# Patient Record
Sex: Female | Born: 1985 | Race: White | Hispanic: No | Marital: Single | Smoking: Never smoker
Health system: Southern US, Community
[De-identification: ages and names within clinical notes are randomized; demographics above are authoritative.]

## PROBLEM LIST (undated history)

## (undated) ENCOUNTER — Inpatient Hospital Stay (HOSPITAL_COMMUNITY): Payer: Self-pay

## (undated) DIAGNOSIS — F329 Major depressive disorder, single episode, unspecified: Secondary | ICD-10-CM

## (undated) DIAGNOSIS — F32A Depression, unspecified: Secondary | ICD-10-CM

## (undated) DIAGNOSIS — Z789 Other specified health status: Secondary | ICD-10-CM

## (undated) HISTORY — PX: NO PAST SURGERIES: SHX2092

---

## 2007-12-02 ENCOUNTER — Ambulatory Visit (HOSPITAL_COMMUNITY): Admission: RE | Admit: 2007-12-02 | Discharge: 2007-12-02 | Payer: Self-pay | Admitting: Obstetrics

## 2007-12-09 ENCOUNTER — Inpatient Hospital Stay (HOSPITAL_COMMUNITY): Admission: AD | Admit: 2007-12-09 | Discharge: 2007-12-09 | Payer: Self-pay | Admitting: Obstetrics

## 2008-02-10 ENCOUNTER — Inpatient Hospital Stay (HOSPITAL_COMMUNITY): Admission: AD | Admit: 2008-02-10 | Discharge: 2008-02-10 | Payer: Self-pay | Admitting: Obstetrics

## 2008-05-12 ENCOUNTER — Inpatient Hospital Stay (HOSPITAL_COMMUNITY): Admission: AD | Admit: 2008-05-12 | Discharge: 2008-05-12 | Payer: Self-pay | Admitting: Obstetrics

## 2008-07-27 ENCOUNTER — Inpatient Hospital Stay (HOSPITAL_COMMUNITY): Admission: AD | Admit: 2008-07-27 | Discharge: 2008-07-27 | Payer: Self-pay | Admitting: Obstetrics

## 2008-07-30 ENCOUNTER — Inpatient Hospital Stay (HOSPITAL_COMMUNITY): Admission: AD | Admit: 2008-07-30 | Discharge: 2008-07-30 | Payer: Self-pay | Admitting: Obstetrics

## 2008-08-05 ENCOUNTER — Inpatient Hospital Stay (HOSPITAL_COMMUNITY): Admission: AD | Admit: 2008-08-05 | Discharge: 2008-08-07 | Payer: Self-pay | Admitting: Obstetrics

## 2009-12-19 IMAGING — US US OB COMP LESS 14 WK
1 series · 14 of 28 positions shown · non-contrast
Comparison: none

OBSTETRICAL ULTRASOUND:
 This ultrasound exam was performed in the [HOSPITAL] Ultrasound Department.  The OB US report was generated in the AS system, and faxed to the ordering physician.  This report is also available in [REDACTED] PACS.

[Series 1: us ob comp less 14 wks · 14 of 36 slices shown]
[im 2/36]
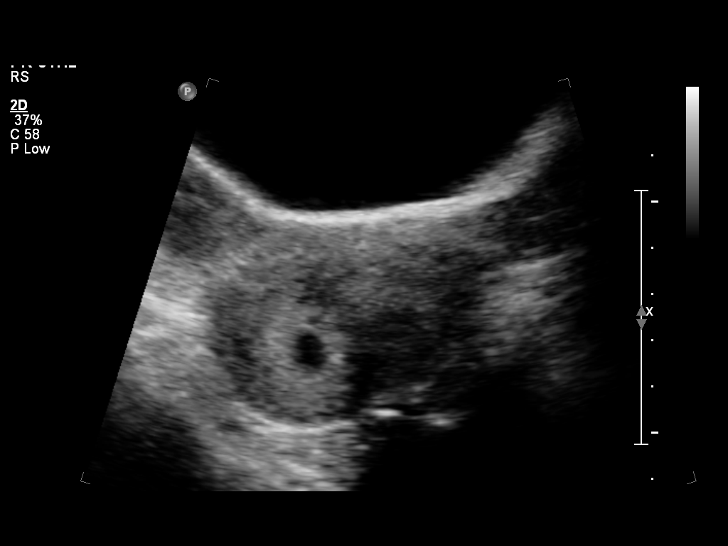
[im 4/36]
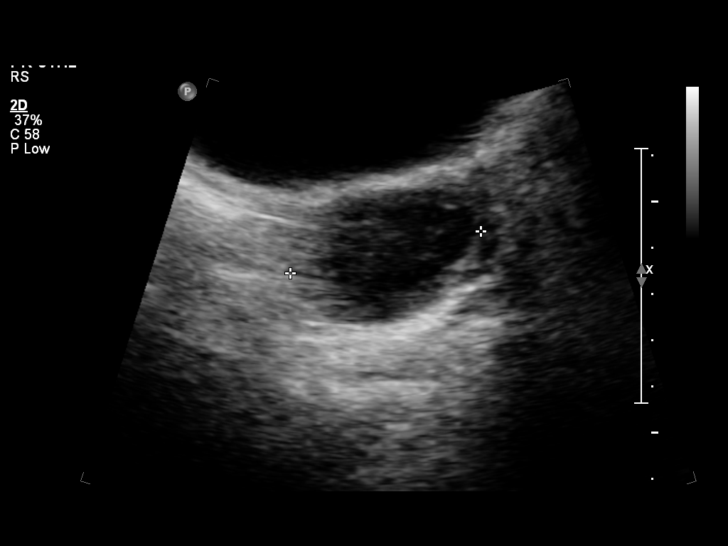
[im 7/36]
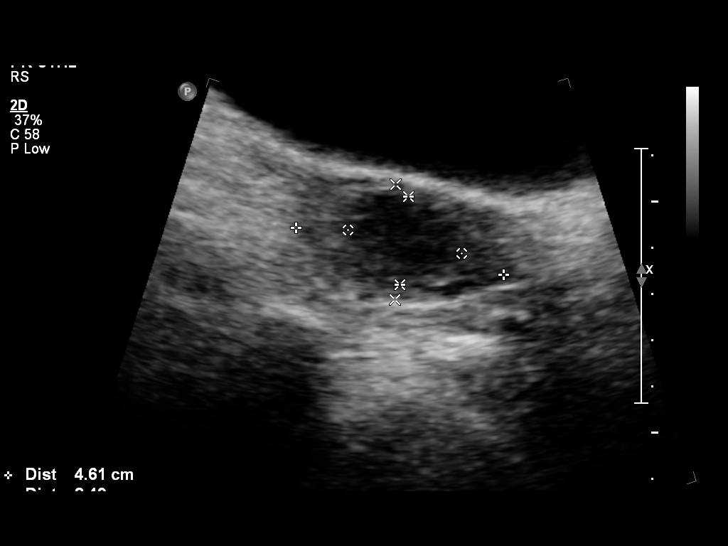
[im 10/36]
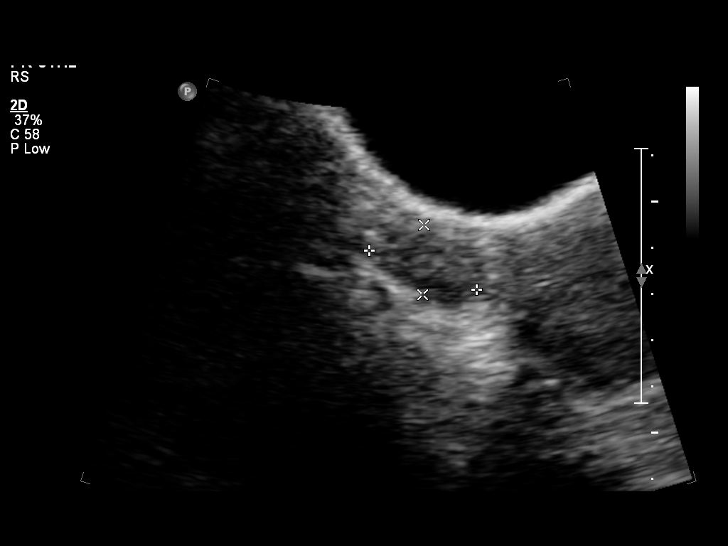
[im 12/36]
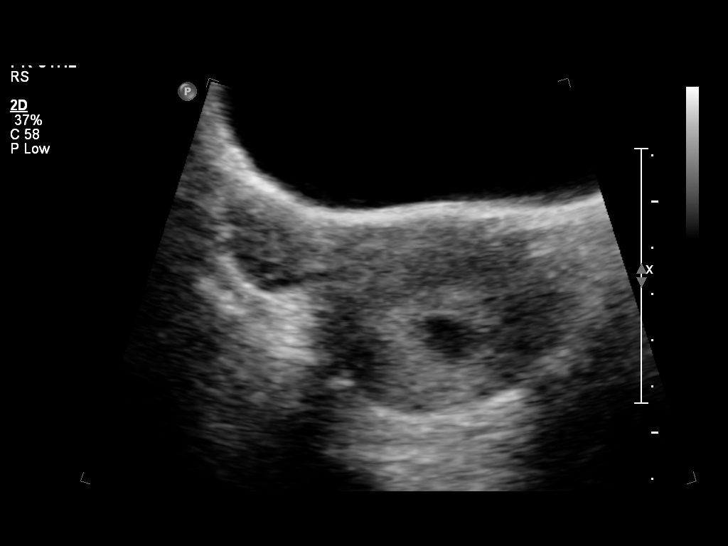
[im 15/36]
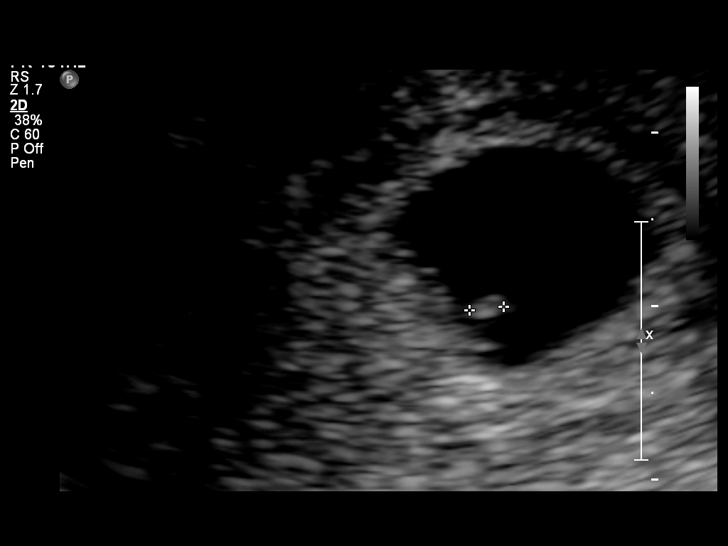
[im 17/36]
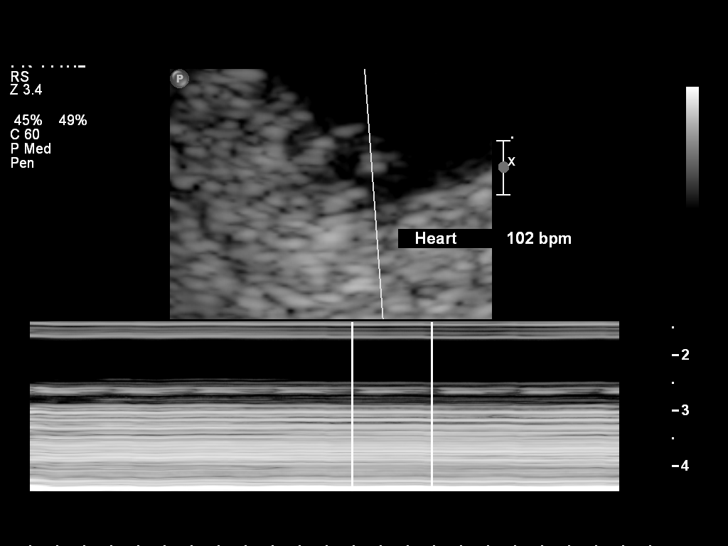
[im 20/36]
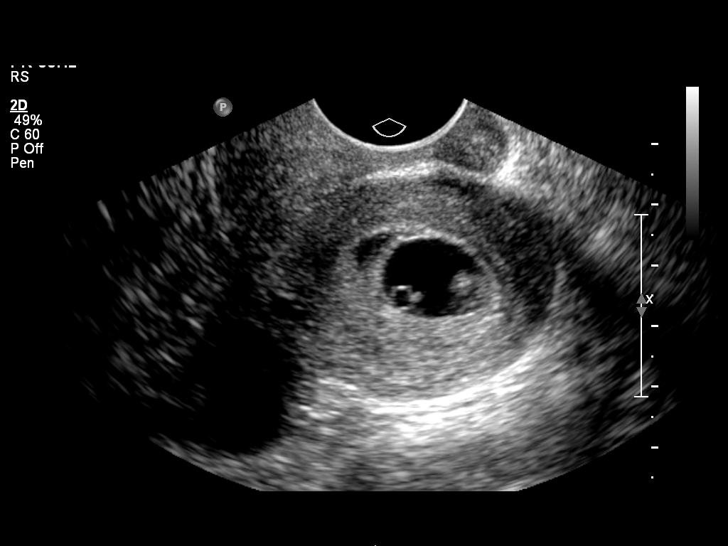
[im 23/36]
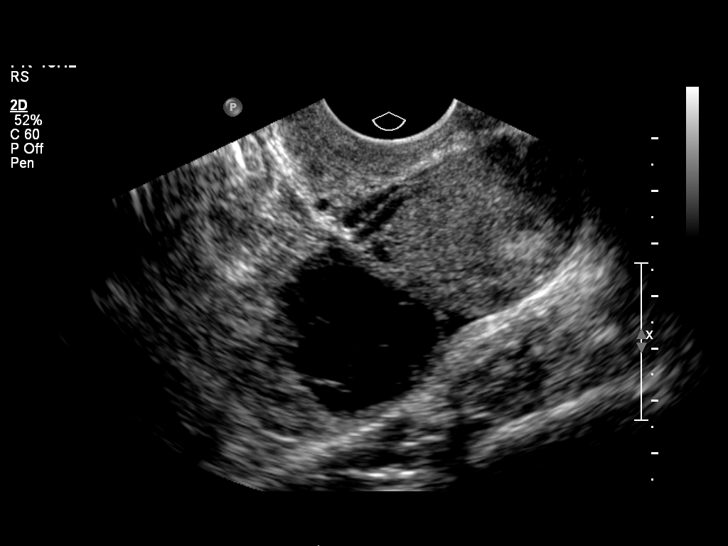
[im 25/36]
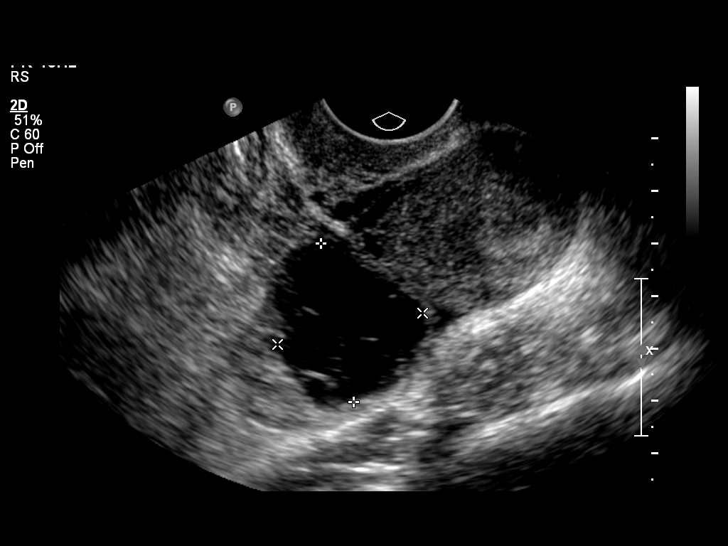
[im 28/36]
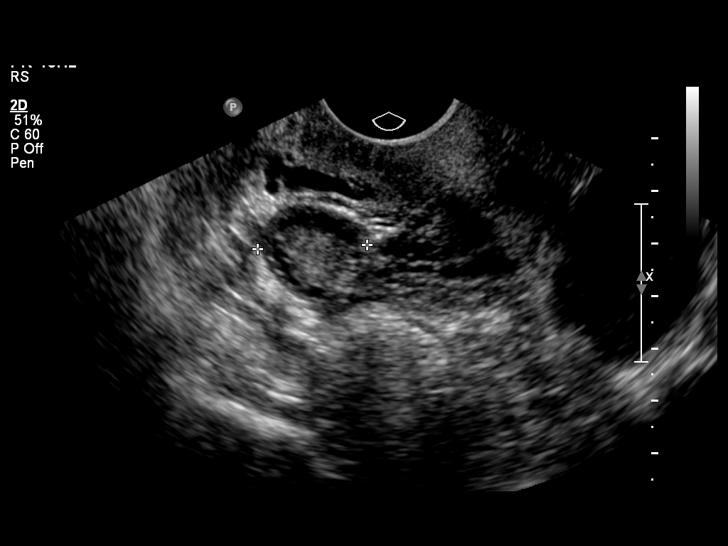
[im 30/36]
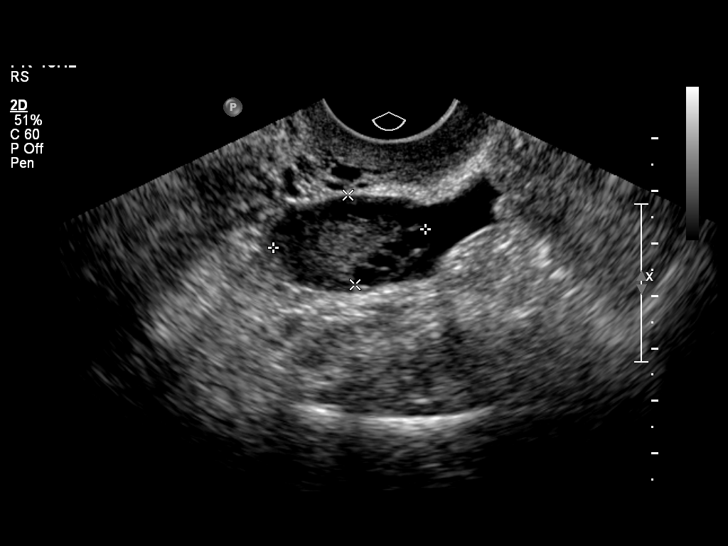
[im 33/36]
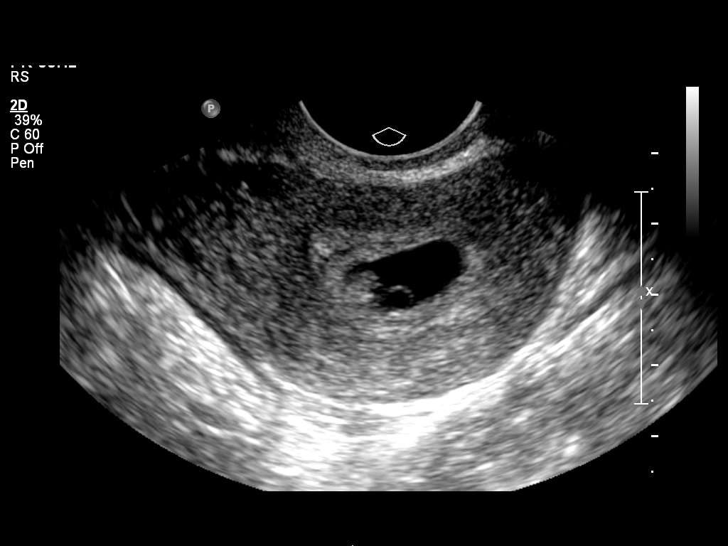
[im 36/36]
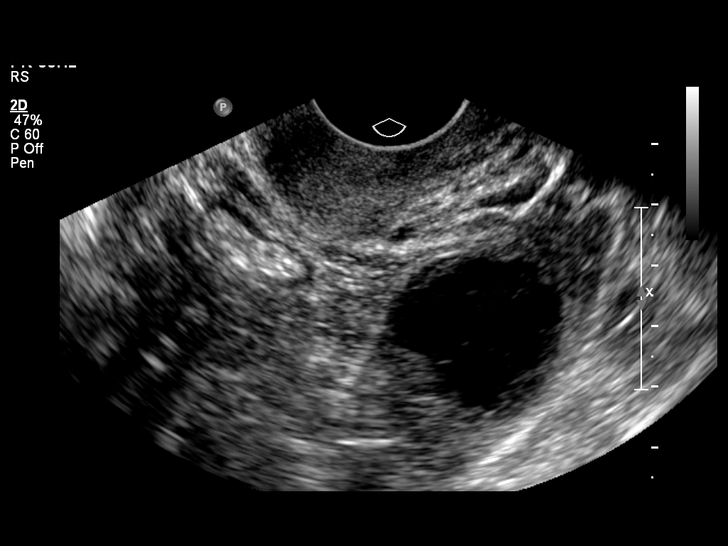

[14 of 28 positions shown; findings below may reference images not displayed]

IMPRESSION: See AS Obstetric US report.

## 2010-08-02 LAB — URINALYSIS, ROUTINE W REFLEX MICROSCOPIC
Bilirubin Urine: NEGATIVE
Hgb urine dipstick: NEGATIVE
Ketones, ur: NEGATIVE mg/dL
Protein, ur: NEGATIVE mg/dL
Urobilinogen, UA: 0.2 mg/dL (ref 0.0–1.0)
pH: 6.5 (ref 5.0–8.0)

## 2010-08-02 LAB — CBC
HCT: 27.6 % — ABNORMAL LOW (ref 36.0–46.0)
Hemoglobin: 9.6 g/dL — ABNORMAL LOW (ref 12.0–15.0)
MCHC: 34.6 g/dL (ref 30.0–36.0)
MCV: 89.8 fL (ref 78.0–100.0)
Platelets: 147 10*3/uL — ABNORMAL LOW (ref 150–400)
Platelets: 165 10*3/uL (ref 150–400)
RDW: 14.6 % (ref 11.5–15.5)
WBC: 18 10*3/uL — ABNORMAL HIGH (ref 4.0–10.5)

## 2010-08-07 LAB — RH IMMUNE GLOBULIN WORKUP (NOT WOMEN'S HOSP): ABO/RH(D): A NEG

## 2011-03-29 LAB — OB RESULTS CONSOLE GC/CHLAMYDIA
Chlamydia: NEGATIVE
Gonorrhea: NEGATIVE

## 2011-03-29 LAB — OB RESULTS CONSOLE HEPATITIS B SURFACE ANTIGEN: Hepatitis B Surface Ag: NEGATIVE

## 2011-03-29 LAB — OB RESULTS CONSOLE HIV ANTIBODY (ROUTINE TESTING): HIV: NONREACTIVE

## 2011-04-24 NOTE — L&D Delivery Note (Signed)
Delivery Note At 2:30 AM a viable female was delivered via Vaginal, Spontaneous Delivery (Presentation: ;  ).  APGAR: , ; weight .   Placenta status: , .  Cord:  with the following complications: .  Cord pH: not done  Anesthesia: Epidural  Episiotomy:  Lacerations:  Suture Repair: 2.0 Est. Blood Loss (mL):   Mom to postpartum.  Baby to nursery-stable.  Jaxxon Naeem A 09/11/2011, 2:37 AM

## 2011-06-25 ENCOUNTER — Other Ambulatory Visit: Payer: Self-pay | Admitting: Obstetrics

## 2011-07-02 ENCOUNTER — Encounter (HOSPITAL_COMMUNITY): Payer: Self-pay | Admitting: *Deleted

## 2011-07-02 ENCOUNTER — Inpatient Hospital Stay (HOSPITAL_COMMUNITY)
Admission: AD | Admit: 2011-07-02 | Discharge: 2011-07-02 | Disposition: A | Discharge status: Home Health | Payer: Medicaid Other | Source: Ambulatory Visit | Attending: Obstetrics | Admitting: Obstetrics

## 2011-07-02 DIAGNOSIS — Z2989 Encounter for other specified prophylactic measures: Secondary | ICD-10-CM | POA: Insufficient documentation

## 2011-07-02 DIAGNOSIS — Z298 Encounter for other specified prophylactic measures: Secondary | ICD-10-CM | POA: Insufficient documentation

## 2011-07-02 DIAGNOSIS — Z3480 Encounter for supervision of other normal pregnancy, unspecified trimester: Secondary | ICD-10-CM | POA: Insufficient documentation

## 2011-07-02 MED ORDER — RHO D IMMUNE GLOBULIN 1500 UNIT/2ML IJ SOLN
300.0000 ug | Freq: Once | INTRAMUSCULAR | Status: AC
  Administered 2011-07-02: 300 ug via INTRAMUSCULAR
  Filled 2011-07-02: qty 2

## 2011-07-02 NOTE — MAU Note (Signed)
Pt sttes the MD instructed her to come here and receive a rhogram shot

## 2011-07-03 LAB — RH IG WORKUP (INCLUDES ABO/RH)
Antibody Screen: NEGATIVE
Gestational Age(Wks): 31.1
Unit division: 0

## 2011-07-24 LAB — OB RESULTS CONSOLE GBS: GBS: NEGATIVE

## 2011-09-06 ENCOUNTER — Encounter (HOSPITAL_COMMUNITY): Payer: Self-pay | Admitting: *Deleted

## 2011-09-06 ENCOUNTER — Telehealth (HOSPITAL_COMMUNITY): Payer: Self-pay | Admitting: *Deleted

## 2011-09-06 NOTE — Telephone Encounter (Signed)
Preadmission screen  

## 2011-09-07 ENCOUNTER — Encounter (HOSPITAL_COMMUNITY): Payer: Self-pay | Admitting: *Deleted

## 2011-09-07 ENCOUNTER — Encounter (HOSPITAL_COMMUNITY): Payer: Self-pay

## 2011-09-07 ENCOUNTER — Inpatient Hospital Stay (HOSPITAL_COMMUNITY)
Admission: AD | Admit: 2011-09-07 | Discharge: 2011-09-07 | Disposition: A | Discharge status: Home / Self Care | Payer: Medicaid Other | Source: Ambulatory Visit | Attending: Obstetrics | Admitting: Obstetrics

## 2011-09-07 ENCOUNTER — Inpatient Hospital Stay (HOSPITAL_COMMUNITY)
Admission: AD | Admit: 2011-09-07 | Discharge: 2011-09-08 | Disposition: A | Discharge status: Home / Self Care | Payer: Medicaid Other | Source: Ambulatory Visit | Attending: Obstetrics | Admitting: Obstetrics

## 2011-09-07 DIAGNOSIS — M549 Dorsalgia, unspecified: Secondary | ICD-10-CM | POA: Insufficient documentation

## 2011-09-07 DIAGNOSIS — O479 False labor, unspecified: Secondary | ICD-10-CM | POA: Insufficient documentation

## 2011-09-07 HISTORY — DX: Other specified health status: Z78.9

## 2011-09-07 NOTE — Discharge Instructions (Signed)

## 2011-09-07 NOTE — MAU Note (Signed)
Painful contractions every 3 minutes since 10pm tonight. Denies leaking of fluid or vaginal bleeding. Reports positive fetal movement. Denies complications with pregnancy.

## 2011-09-07 NOTE — MAU Note (Signed)
Pt C/O lower back pain since 1300, took Tylenol with Codeine with no relief, uc's becoming more regular, denies bleeding or LOF.

## 2011-09-07 NOTE — Progress Notes (Signed)
Dr. Clearance Coots in MAU, informed him of pt status, SVE 1-2/60/-2... See DC order.

## 2011-09-08 MED ORDER — OXYCODONE-ACETAMINOPHEN 5-325 MG PO TABS
2.0000 | ORAL_TABLET | Freq: Once | ORAL | Status: AC
  Administered 2011-09-08: 2 via ORAL
  Filled 2011-09-08: qty 2

## 2011-09-08 NOTE — Discharge Instructions (Signed)

## 2011-09-10 ENCOUNTER — Encounter (HOSPITAL_COMMUNITY): Payer: Self-pay | Admitting: Anesthesiology

## 2011-09-10 ENCOUNTER — Inpatient Hospital Stay (HOSPITAL_COMMUNITY)
Admission: RE | Admit: 2011-09-10 | Discharge: 2011-09-13 | DRG: 775 | Disposition: A | Discharge status: Home / Self Care | Payer: Medicaid Other | Source: Ambulatory Visit | Attending: Obstetrics | Admitting: Obstetrics

## 2011-09-10 ENCOUNTER — Inpatient Hospital Stay (HOSPITAL_COMMUNITY): Payer: Medicaid Other | Admitting: Anesthesiology

## 2011-09-10 ENCOUNTER — Encounter (HOSPITAL_COMMUNITY): Payer: Self-pay

## 2011-09-10 DIAGNOSIS — O480 Post-term pregnancy: Principal | ICD-10-CM | POA: Diagnosis present

## 2011-09-10 LAB — CBC
HCT: 35.4 % — ABNORMAL LOW (ref 36.0–46.0)
MCH: 29.9 pg (ref 26.0–34.0)
MCHC: 33.3 g/dL (ref 30.0–36.0)
MCV: 89.6 fL (ref 78.0–100.0)
RDW: 13.6 % (ref 11.5–15.5)

## 2011-09-10 MED ORDER — PHENYLEPHRINE 40 MCG/ML (10ML) SYRINGE FOR IV PUSH (FOR BLOOD PRESSURE SUPPORT): 80.0000 ug | PREFILLED_SYRINGE | INTRAVENOUS | Status: DC | PRN

## 2011-09-10 MED ORDER — CITRIC ACID-SODIUM CITRATE 334-500 MG/5ML PO SOLN: 30.0000 mL | ORAL | Status: DC | PRN

## 2011-09-10 MED ORDER — EPHEDRINE 5 MG/ML INJ: 10.0000 mg | INTRAVENOUS | Status: DC | PRN

## 2011-09-10 MED ORDER — ONDANSETRON HCL 4 MG/2ML IJ SOLN
4.0000 mg | Freq: Four times a day (QID) | INTRAMUSCULAR | Status: DC | PRN
  Administered 2011-09-10: 4 mg via INTRAVENOUS
  Filled 2011-09-10: qty 2

## 2011-09-10 MED ORDER — PHENYLEPHRINE 40 MCG/ML (10ML) SYRINGE FOR IV PUSH (FOR BLOOD PRESSURE SUPPORT)
80.0000 ug | PREFILLED_SYRINGE | INTRAVENOUS | Status: DC | PRN
  Filled 2011-09-10 (×2): qty 5

## 2011-09-10 MED ORDER — TERBUTALINE SULFATE 1 MG/ML IJ SOLN: 0.2500 mg | Freq: Once | INTRAMUSCULAR | Status: AC | PRN

## 2011-09-10 MED ORDER — EPHEDRINE 5 MG/ML INJ
10.0000 mg | INTRAVENOUS | Status: DC | PRN
  Filled 2011-09-10 (×2): qty 4

## 2011-09-10 MED ORDER — DIPHENHYDRAMINE HCL 50 MG/ML IJ SOLN
12.5000 mg | INTRAMUSCULAR | Status: DC | PRN
Start: 2011-09-10 — End: 2011-09-13
  Administered 2011-09-10 (×2): 12.5 mg via INTRAVENOUS
  Filled 2011-09-10: qty 1

## 2011-09-10 MED ORDER — FENTANYL 2.5 MCG/ML BUPIVACAINE 1/10 % EPIDURAL INFUSION (WH - ANES)
14.0000 mL/h | INTRAMUSCULAR | Status: DC
  Administered 2011-09-10 – 2011-09-11 (×4): 14 mL/h via EPIDURAL
  Filled 2011-09-10 (×4): qty 60

## 2011-09-10 MED ORDER — LACTATED RINGERS IV SOLN
INTRAVENOUS | Status: DC
  Administered 2011-09-10 (×4): via INTRAVENOUS

## 2011-09-10 MED ORDER — LACTATED RINGERS IV SOLN
500.0000 mL | INTRAVENOUS | Status: DC | PRN
Start: 2011-09-10 — End: 2011-09-11

## 2011-09-10 MED ORDER — BUTORPHANOL TARTRATE 2 MG/ML IJ SOLN: 1.0000 mg | INTRAMUSCULAR | Status: DC | PRN

## 2011-09-10 MED ORDER — LIDOCAINE HCL (PF) 1 % IJ SOLN
INTRAMUSCULAR | Status: DC | PRN
  Administered 2011-09-10 (×3): 4 mL

## 2011-09-10 MED ORDER — LACTATED RINGERS IV SOLN: 500.0000 mL | Freq: Once | INTRAVENOUS | Status: DC

## 2011-09-10 MED ORDER — OXYCODONE-ACETAMINOPHEN 5-325 MG PO TABS
1.0000 | ORAL_TABLET | ORAL | Status: DC | PRN
  Administered 2011-09-12 – 2011-09-13 (×3): 1 via ORAL
  Filled 2011-09-10 (×2): qty 1

## 2011-09-10 MED ORDER — OXYTOCIN 20 UNITS IN LACTATED RINGERS INFUSION - SIMPLE
1.0000 m[IU]/min | INTRAVENOUS | Status: DC
  Administered 2011-09-10: 1 m[IU]/min via INTRAVENOUS
  Filled 2011-09-10: qty 1000

## 2011-09-10 MED ORDER — OXYTOCIN BOLUS FROM INFUSION
500.0000 mL | Freq: Once | INTRAVENOUS | Status: DC
  Filled 2011-09-10: qty 500

## 2011-09-10 MED ORDER — OXYTOCIN 20 UNITS IN LACTATED RINGERS INFUSION - SIMPLE
125.0000 mL/h | Freq: Once | INTRAVENOUS | Status: AC
  Administered 2011-09-11: 125 mL/h via INTRAVENOUS
  Filled 2011-09-10: qty 1000

## 2011-09-10 MED ORDER — ACETAMINOPHEN 325 MG PO TABS: 650.0000 mg | ORAL_TABLET | ORAL | Status: DC | PRN

## 2011-09-10 MED ORDER — IBUPROFEN 600 MG PO TABS
600.0000 mg | ORAL_TABLET | Freq: Four times a day (QID) | ORAL | Status: DC | PRN
  Filled 2011-09-10 (×5): qty 1

## 2011-09-10 MED ORDER — FLEET ENEMA 7-19 GM/118ML RE ENEM: 1.0000 | ENEMA | RECTAL | Status: DC | PRN

## 2011-09-10 MED ORDER — LIDOCAINE HCL (PF) 1 % IJ SOLN
30.0000 mL | INTRAMUSCULAR | Status: DC | PRN
  Filled 2011-09-10: qty 30

## 2011-09-10 NOTE — Progress Notes (Signed)
Patient ID: Cynthia Hawkins, female   DOB: 18-Mar-1986, 26 y.o.   MRN: [id number] Patient is still 1-2 cm 85% vertex -3 amniotomy was performed the fluid was slight meconium she is on 5 milliunits of Pitocin tracing is reactive

## 2011-09-10 NOTE — H&P (Signed)
This is Dr. Francoise Ceo dictating the history and physical on  Cynthia Hawkins she is a gravida 2 para 101 at 41 weeks and 1 day she is due and 09/02/2011 negative GBS and she brought in for induction cervix is 1 cm 80% vertex -3 station membranes intact she is on low-dose Pitocin 5 milliunits per minute at the present time contracting every 3-4 minutes Past medical history negative Past surgical history negative Social history negative System review negative Physical exam revealed a well-developed female in no distress Lungs clear to P&A Heart regular rhythm no murmurs no gallops Lungs clear to P&A Abdomen term estimated fetal weight between 7 and 8 pounds Pelvic as described above Extremities negative

## 2011-09-10 NOTE — Anesthesia Preprocedure Evaluation (Signed)
Anesthesia Evaluation  Patient identified by MRN, date of birth, ID band Patient awake    Reviewed: Allergy & Precautions, H&P , NPO status , Patient's Chart, lab work & pertinent test results, reviewed documented beta blocker date and time   History of Anesthesia Complications Negative for: history of anesthetic complications  Airway Mallampati: II TM Distance: >3 FB Neck ROM: full    Dental  (+) Teeth Intact   Pulmonary neg pulmonary ROS,  breath sounds clear to auscultation        Cardiovascular negative cardio ROS  Rhythm:regular Rate:Normal     Neuro/Psych Has been on tylenol #3 since January for back pain with pregnancy negative neurological ROS  negative psych ROS   GI/Hepatic negative GI ROS, Neg liver ROS,   Endo/Other  Morbid obesity  Renal/GU negative Renal ROS  negative genitourinary   Musculoskeletal   Abdominal   Peds  Hematology negative hematology ROS (+)   Anesthesia Other Findings   Reproductive/Obstetrics (+) Pregnancy                           Anesthesia Physical Anesthesia Plan  ASA: II  Anesthesia Plan: Epidural   Post-op Pain Management:    Induction:   Airway Management Planned:   Additional Equipment:   Intra-op Plan:   Post-operative Plan:   Informed Consent: I have reviewed the patients History and Physical, chart, labs and discussed the procedure including the risks, benefits and alternatives for the proposed anesthesia with the patient or authorized representative who has indicated his/her understanding and acceptance.     Plan Discussed with:   Anesthesia Plan Comments:         Anesthesia Quick Evaluation

## 2011-09-10 NOTE — Anesthesia Procedure Notes (Signed)
Epidural Patient location during procedure: OB Start time: 09/10/2011 2:57 PM Reason for block: procedure for pain  Staffing Performed by: anesthesiologist   Preanesthetic Checklist Completed: patient identified, site marked, surgical consent, pre-op evaluation, timeout performed, IV checked, risks and benefits discussed and monitors and equipment checked  Epidural Patient position: sitting Prep: site prepped and draped and DuraPrep Patient monitoring: continuous pulse ox and blood pressure Approach: midline Injection technique: LOR air  Needle:  Needle type: Tuohy  Needle gauge: 17 G Needle length: 9 cm Needle insertion depth: 6 cm Catheter type: closed end flexible Catheter size: 19 Gauge Catheter at skin depth: 11 cm Test dose: negative  Assessment Events: blood not aspirated, injection not painful, no injection resistance, negative IV test and no paresthesia  Additional Notes Discussed risk of headache, infection, bleeding, nerve injury and failed or incomplete block.  Patient voices understanding and wishes to proceed.

## 2011-09-11 ENCOUNTER — Encounter (HOSPITAL_COMMUNITY): Payer: Self-pay

## 2011-09-11 MED ORDER — ONDANSETRON HCL 4 MG PO TABS: 4.0000 mg | ORAL_TABLET | ORAL | Status: DC | PRN

## 2011-09-11 MED ORDER — WITCH HAZEL-GLYCERIN EX PADS: 1.0000 "application " | MEDICATED_PAD | CUTANEOUS | Status: DC | PRN

## 2011-09-11 MED ORDER — SENNOSIDES-DOCUSATE SODIUM 8.6-50 MG PO TABS
2.0000 | ORAL_TABLET | Freq: Every day | ORAL | Status: DC
  Administered 2011-09-11 – 2011-09-12 (×2): 2 via ORAL

## 2011-09-11 MED ORDER — PRENATAL MULTIVITAMIN CH
1.0000 | ORAL_TABLET | Freq: Every day | ORAL | Status: DC
  Administered 2011-09-11 – 2011-09-13 (×3): 1 via ORAL
  Filled 2011-09-11 (×3): qty 1

## 2011-09-11 MED ORDER — SIMETHICONE 80 MG PO CHEW: 80.0000 mg | CHEWABLE_TABLET | ORAL | Status: DC | PRN

## 2011-09-11 MED ORDER — ZOLPIDEM TARTRATE 5 MG PO TABS: 5.0000 mg | ORAL_TABLET | Freq: Every evening | ORAL | Status: DC | PRN

## 2011-09-11 MED ORDER — LANOLIN HYDROUS EX OINT: TOPICAL_OINTMENT | CUTANEOUS | Status: DC | PRN

## 2011-09-11 MED ORDER — DIBUCAINE 1 % RE OINT: 1.0000 "application " | TOPICAL_OINTMENT | RECTAL | Status: DC | PRN

## 2011-09-11 MED ORDER — BENZOCAINE-MENTHOL 20-0.5 % EX AERO: 1.0000 "application " | INHALATION_SPRAY | CUTANEOUS | Status: DC | PRN

## 2011-09-11 MED ORDER — OXYCODONE-ACETAMINOPHEN 5-325 MG PO TABS
1.0000 | ORAL_TABLET | ORAL | Status: DC | PRN
  Filled 2011-09-11: qty 1

## 2011-09-11 MED ORDER — ONDANSETRON HCL 4 MG/2ML IJ SOLN: 4.0000 mg | INTRAMUSCULAR | Status: DC | PRN

## 2011-09-11 MED ORDER — IBUPROFEN 600 MG PO TABS
600.0000 mg | ORAL_TABLET | Freq: Four times a day (QID) | ORAL | Status: DC
  Administered 2011-09-11 – 2011-09-13 (×9): 600 mg via ORAL
  Filled 2011-09-11 (×4): qty 1

## 2011-09-11 MED ORDER — DIPHENHYDRAMINE HCL 25 MG PO CAPS: 25.0000 mg | ORAL_CAPSULE | Freq: Four times a day (QID) | ORAL | Status: DC | PRN

## 2011-09-11 MED ORDER — SODIUM BICARBONATE 8.4 % IV SOLN
INTRAVENOUS | Status: DC | PRN
  Administered 2011-09-11: 3 mL via EPIDURAL

## 2011-09-11 MED ORDER — TETANUS-DIPHTH-ACELL PERTUSSIS 5-2.5-18.5 LF-MCG/0.5 IM SUSP
0.5000 mL | Freq: Once | INTRAMUSCULAR | Status: AC
  Administered 2011-09-12: 0.5 mL via INTRAMUSCULAR
  Filled 2011-09-11: qty 0.5

## 2011-09-11 MED ORDER — FERROUS SULFATE 325 (65 FE) MG PO TABS
325.0000 mg | ORAL_TABLET | Freq: Two times a day (BID) | ORAL | Status: DC
  Administered 2011-09-11 – 2011-09-13 (×5): 325 mg via ORAL
  Filled 2011-09-11 (×5): qty 1

## 2011-09-11 NOTE — Anesthesia Postprocedure Evaluation (Signed)
  Anesthesia Post Note  Patient: Cynthia Hawkins  Procedure(s) Performed: * No procedures listed *  Anesthesia type: Epidural  Patient location: Mother/Baby  Post pain: Pain level controlled  Post assessment: Post-op Vital signs reviewed  Last Vitals:  Filed Vitals:   09/11/11 0900  BP: 120/75  Pulse: 86  Temp: 36.6 C  Resp: 18    Post vital signs: Reviewed  Level of consciousness:alert  Complications: No apparent anesthesia complications

## 2011-09-11 NOTE — Progress Notes (Signed)
Patient ID: Cynthia Hawkins, female   DOB: 02-25-1986, 26 y.o.   MRN: [id number] Patient pushing for now but on further examination she is not that H. Is not anterior lip 20 station to a low patient to labor and him tracing is reactive occasional mild variable

## 2011-09-11 NOTE — Progress Notes (Signed)
Patient ID: Cynthia Hawkins, female   DOB: 06-Nov-1985, 26 y.o.   MRN: [id number] At 2 AM patient was Cynthia Hawkins fully dilated having small bearable is started pushing and 06/06/2023 she was crowning and had a normal vaginal delivery of a female Apgar 80 from the position and no episiotomy or lacerations and

## 2011-09-11 NOTE — Progress Notes (Signed)
CM / UR chart review completed.  

## 2011-09-12 LAB — CBC
MCV: 90.9 fL (ref 78.0–100.0)
Platelets: 137 10*3/uL — ABNORMAL LOW (ref 150–400)
RBC: 3.62 MIL/uL — ABNORMAL LOW (ref 3.87–5.11)
WBC: 8.9 10*3/uL (ref 4.0–10.5)

## 2011-09-12 MED ORDER — MEASLES, MUMPS & RUBELLA VAC ~~LOC~~ INJ
0.5000 mL | INJECTION | Freq: Once | SUBCUTANEOUS | Status: AC
  Administered 2011-09-13: 0.5 mL via SUBCUTANEOUS
  Filled 2011-09-12: qty 0.5

## 2011-09-12 NOTE — Discharge Summary (Signed)
Obstetric Discharge Summary Reason for Admission: induction of labor Prenatal Procedures: none Intrapartum Procedures: spontaneous vaginal delivery Postpartum Procedures: none Complications-Operative and Postpartum: none Hemoglobin  Date Value Range Status  09/12/2011 10.9* 12.0-15.0 (g/dL) Final     HCT  Date Value Range Status  09/12/2011 32.9* 36.0-46.0 (%) Final    Physical Exam:  General: alert Lochia: appropriate Uterine Fundus: firm Incision: healing well DVT Evaluation: No evidence of DVT seen on physical exam.  Discharge Diagnoses: Term Pregnancy-delivered  Discharge Information: Date: 09/12/2011 Activity: pelvic rest Diet: routine Medications: Percocet Condition: stable Instructions: refer to practice specific booklet Discharge to: home Follow-up Information    Follow up with Cynthia Mcmeans A, MD. Call in 6 weeks.   Contact information:   [address] [place] [address] [phone]          Newborn Data: Live born female  Birth Weight: 9 lb 11.4 oz (4406 g) APGAR: 9, 9  Home with mother.  Cynthia Hawkins 09/12/2011, 7:17 AM

## 2011-09-12 NOTE — Progress Notes (Signed)
Patient ID: Cynthia Hawkins, female   DOB: Apr 16, 1986, 26 y.o.   MRN: [id number] Postpartum day one Vital signs normal Fundus firm Lochia moderate Legs negative

## 2011-09-12 NOTE — Discharge Instructions (Signed)
Discharge instructions   You can wash your hair  Shower  Eat what you want  Drink what you want  See me in 6 weeks  Your ankles are going to swell more in the next 2 weeks than when pregnant  No sex for 6 weeks   Keyler Hoge A, MD 09/12/2011

## 2011-09-13 NOTE — Discharge Summary (Signed)
Obstetric Discharge Summary Reason for Admission: induction of labor Prenatal Procedures: none Intrapartum Procedures: spontaneous vaginal delivery Postpartum Procedures: none Complications-Operative and Postpartum: none Hemoglobin  Date Value Range Status  09/12/2011 10.9* 12.0-15.0 (g/dL) Final     HCT  Date Value Range Status  09/12/2011 32.9* 36.0-46.0 (%) Final    Physical Exam:  General: alert Lochia: appropriate Uterine Fundus: firm Incision: healing well DVT Evaluation: No evidence of DVT seen on physical exam.  Discharge Diagnoses: Term Pregnancy-delivered  Discharge Information: Date: 09/13/2011 Activity: pelvic rest Diet: routine Medications: Percocet Condition: stable Instructions: refer to practice specific booklet Discharge to: home Follow-up Information    Follow up with Tylyn Derwin A, MD. Call in 6 weeks.   Contact information:   [address] [place] [address] [phone]          Newborn Data: Live born female  Birth Weight: 9 lb 11.4 oz (4406 g) APGAR: 9, 9  Home with mother.  Mariell Nester A 09/13/2011, 7:34 AM

## 2011-10-30 ENCOUNTER — Other Ambulatory Visit: Payer: Self-pay | Admitting: Obstetrics

## 2011-11-08 ENCOUNTER — Encounter (HOSPITAL_COMMUNITY): Payer: Self-pay | Admitting: Pharmacist

## 2011-11-15 ENCOUNTER — Inpatient Hospital Stay (HOSPITAL_COMMUNITY): Admission: RE | Admit: 2011-11-15 | Payer: Medicaid Other | Source: Ambulatory Visit

## 2011-11-19 NOTE — H&P (Signed)
NAME:  Cynthia Hawkins, Cynthia Hawkins                ACCOUNT NO.:  [id number]  MEDICAL RECORD NO.:  [id number]  LOCATION:  PERIO                         FACILITY:  [facility]  PHYSICIAN:  Kathreen Cosier, M.D.DATE OF BIRTH:  12/01/1985  DATE OF ADMISSION:  10/30/2011 DATE OF DISCHARGE:                             HISTORY & PHYSICAL   HISTORY OF PRESENT ILLNESS:  The patient is a 26 year old gravida 2, para 2-0-0-2, who desires sterilization.  She understands the procedure can fail resulting in pregnancy on the tube or uterus.  PAST MEDICAL HISTORY:  History of depression including postpartum depression for which, she was treated with Zoloft 100 mg p.o. daily.  PAST SURGICAL HISTORY:  Negative.  SYSTEM REVIEW:  Negative.  PHYSICAL EXAMINATION:  GENERAL:  Revealed a well-developed female in no distress. HEENT:  Negative. LUNGS:  Clear to P and A. HEART:  Regular rhythm.  No murmurs.  No gallops. BREAST:  Engorged. ABDOMEN:  Negative.  Uterus normal.  Negative adnexa. EXTERNAL GENITALIA:  Cervix normal. EXTREMITIES:  Negative.          ______________________________ Kathreen Cosier, M.D.     BAM/MEDQ  D:  11/19/2011  T:  11/19/2011  Job:  [id number]

## 2011-11-20 ENCOUNTER — Inpatient Hospital Stay (HOSPITAL_COMMUNITY): Admission: RE | Admit: 2011-11-20 | Payer: Medicaid Other | Source: Ambulatory Visit

## 2011-11-22 ENCOUNTER — Encounter (HOSPITAL_COMMUNITY): Payer: Self-pay

## 2011-11-22 ENCOUNTER — Encounter (HOSPITAL_COMMUNITY)
Admission: RE | Admit: 2011-11-22 | Discharge: 2011-11-22 | Disposition: A | Discharge status: Home / Self Care | Payer: Medicaid Other | Source: Ambulatory Visit | Attending: Obstetrics | Admitting: Obstetrics

## 2011-11-22 HISTORY — DX: Depression, unspecified: F32.A

## 2011-11-22 HISTORY — DX: Major depressive disorder, single episode, unspecified: F32.9

## 2011-11-22 LAB — CBC
HCT: 40.1 % (ref 36.0–46.0)
Hemoglobin: 13.2 g/dL (ref 12.0–15.0)
MCH: 29.1 pg (ref 26.0–34.0)
MCHC: 32.9 g/dL (ref 30.0–36.0)
MCV: 88.5 fL (ref 78.0–100.0)
Platelets: 200 10*3/uL (ref 150–400)
RBC: 4.53 MIL/uL (ref 3.87–5.11)
RDW: 14.5 % (ref 11.5–15.5)
WBC: 5.9 10*3/uL (ref 4.0–10.5)

## 2011-11-22 LAB — SURGICAL PCR SCREEN
MRSA, PCR: NEGATIVE
Staphylococcus aureus: NEGATIVE

## 2011-11-22 NOTE — Patient Instructions (Signed)
Your procedure is scheduled on:11/29/11  Enter through the Main Entrance at :6am Pick up desk phone and dial [phone] and inform us of your arrival.  Please call [phone] if you have any problems the morning of surgery.  Remember: Do not eat after midnight:Wed. Do not drink after:midnight Wed  Take these meds the morning of surgery with a sip of water:none  DO NOT wear jewelry, eye make-up, lipstick,body lotion, or dark fingernail polish. Do not shave for 48 hours prior to surgery.  Patients discharged on the day of surgery will not be allowed to drive home.   Remember to use your Hibiclens as instructed.

## 2011-11-29 ENCOUNTER — Encounter (HOSPITAL_COMMUNITY): Payer: Self-pay | Admitting: General Practice

## 2011-11-29 ENCOUNTER — Encounter (HOSPITAL_COMMUNITY)
Admission: RE | Disposition: A | Discharge status: Home / Self Care | Payer: Self-pay | Source: Ambulatory Visit | Attending: Obstetrics

## 2011-11-29 ENCOUNTER — Encounter (HOSPITAL_COMMUNITY): Payer: Self-pay | Admitting: Anesthesiology

## 2011-11-29 ENCOUNTER — Ambulatory Visit (HOSPITAL_COMMUNITY): Payer: Medicaid Other | Admitting: Anesthesiology

## 2011-11-29 ENCOUNTER — Ambulatory Visit (HOSPITAL_COMMUNITY)
Admission: RE | Admit: 2011-11-29 | Discharge: 2011-11-29 | Disposition: A | Discharge status: Home / Self Care | Payer: Medicaid Other | Source: Ambulatory Visit | Attending: Obstetrics | Admitting: Obstetrics

## 2011-11-29 DIAGNOSIS — Z01812 Encounter for preprocedural laboratory examination: Secondary | ICD-10-CM | POA: Insufficient documentation

## 2011-11-29 DIAGNOSIS — Z302 Encounter for sterilization: Secondary | ICD-10-CM | POA: Insufficient documentation

## 2011-11-29 DIAGNOSIS — Z9851 Tubal ligation status: Secondary | ICD-10-CM

## 2011-11-29 DIAGNOSIS — Z01818 Encounter for other preprocedural examination: Secondary | ICD-10-CM | POA: Insufficient documentation

## 2011-11-29 HISTORY — PX: LAPAROSCOPIC TUBAL LIGATION: SHX1937

## 2011-11-29 LAB — PREGNANCY, URINE: Preg Test, Ur: NEGATIVE

## 2011-11-29 SURGERY — LIGATION, FALLOPIAN TUBE, LAPAROSCOPIC
Anesthesia: General | Site: Abdomen | Laterality: Bilateral | Wound class: Clean Contaminated

## 2011-11-29 MED ORDER — NEOSTIGMINE METHYLSULFATE 1 MG/ML IJ SOLN
INTRAMUSCULAR | Status: DC | PRN
  Administered 2011-11-29: 3 mg via INTRAVENOUS

## 2011-11-29 MED ORDER — FENTANYL CITRATE 0.05 MG/ML IJ SOLN
25.0000 ug | INTRAMUSCULAR | Status: DC | PRN
  Administered 2011-11-29: 50 ug via INTRAVENOUS

## 2011-11-29 MED ORDER — DEXAMETHASONE SODIUM PHOSPHATE 10 MG/ML IJ SOLN
INTRAMUSCULAR | Status: AC
  Filled 2011-11-29: qty 1

## 2011-11-29 MED ORDER — KETOROLAC TROMETHAMINE 30 MG/ML IJ SOLN
INTRAMUSCULAR | Status: DC | PRN
  Administered 2011-11-29: 30 mg via INTRAVENOUS

## 2011-11-29 MED ORDER — GLYCOPYRROLATE 0.2 MG/ML IJ SOLN
INTRAMUSCULAR | Status: AC
  Filled 2011-11-29: qty 1

## 2011-11-29 MED ORDER — LIDOCAINE HCL (CARDIAC) 20 MG/ML IV SOLN
INTRAVENOUS | Status: AC
  Filled 2011-11-29: qty 5

## 2011-11-29 MED ORDER — KETOROLAC TROMETHAMINE 30 MG/ML IJ SOLN
INTRAMUSCULAR | Status: AC
  Filled 2011-11-29: qty 1

## 2011-11-29 MED ORDER — KETOROLAC TROMETHAMINE 30 MG/ML IJ SOLN: 15.0000 mg | Freq: Once | INTRAMUSCULAR | Status: DC | PRN

## 2011-11-29 MED ORDER — OXYCODONE-ACETAMINOPHEN 5-325 MG PO TABS
1.0000 | ORAL_TABLET | ORAL | Status: DC | PRN
  Administered 2011-11-29: 1 via ORAL

## 2011-11-29 MED ORDER — GLYCOPYRROLATE 0.2 MG/ML IJ SOLN
INTRAMUSCULAR | Status: AC
  Filled 2011-11-29: qty 3

## 2011-11-29 MED ORDER — LACTATED RINGERS IV SOLN
INTRAVENOUS | Status: DC
  Administered 2011-11-29 (×3): via INTRAVENOUS

## 2011-11-29 MED ORDER — ONDANSETRON HCL 4 MG/2ML IJ SOLN
INTRAMUSCULAR | Status: DC | PRN
  Administered 2011-11-29: 4 mg via INTRAVENOUS

## 2011-11-29 MED ORDER — FENTANYL CITRATE 0.05 MG/ML IJ SOLN
INTRAMUSCULAR | Status: AC
  Filled 2011-11-29: qty 2

## 2011-11-29 MED ORDER — ROCURONIUM BROMIDE 100 MG/10ML IV SOLN
INTRAVENOUS | Status: DC | PRN
  Administered 2011-11-29: 30 mg via INTRAVENOUS

## 2011-11-29 MED ORDER — DEXAMETHASONE SODIUM PHOSPHATE 4 MG/ML IJ SOLN
INTRAMUSCULAR | Status: DC | PRN
  Administered 2011-11-29: 10 mg via INTRAVENOUS

## 2011-11-29 MED ORDER — FENTANYL CITRATE 0.05 MG/ML IJ SOLN
INTRAMUSCULAR | Status: DC | PRN
  Administered 2011-11-29 (×4): 50 ug via INTRAVENOUS

## 2011-11-29 MED ORDER — OXYCODONE-ACETAMINOPHEN 5-325 MG PO TABS
ORAL_TABLET | ORAL | Status: AC
  Administered 2011-11-29: 1 via ORAL
  Filled 2011-11-29: qty 1

## 2011-11-29 MED ORDER — PROPOFOL 10 MG/ML IV EMUL
INTRAVENOUS | Status: DC | PRN
  Administered 2011-11-29: 30 mg via INTRAVENOUS
  Administered 2011-11-29: 200 mg via INTRAVENOUS

## 2011-11-29 MED ORDER — PROPOFOL 10 MG/ML IV EMUL
INTRAVENOUS | Status: AC
  Filled 2011-11-29: qty 20

## 2011-11-29 MED ORDER — MIDAZOLAM HCL 2 MG/2ML IJ SOLN
INTRAMUSCULAR | Status: AC
  Filled 2011-11-29: qty 2

## 2011-11-29 MED ORDER — MIDAZOLAM HCL 5 MG/5ML IJ SOLN
INTRAMUSCULAR | Status: DC | PRN
  Administered 2011-11-29: 2 mg via INTRAVENOUS

## 2011-11-29 MED ORDER — GLYCOPYRROLATE 0.2 MG/ML IJ SOLN
INTRAMUSCULAR | Status: DC | PRN
  Administered 2011-11-29: 0.6 mg via INTRAVENOUS
  Administered 2011-11-29: 0.1 mg via INTRAVENOUS

## 2011-11-29 MED ORDER — FENTANYL CITRATE 0.05 MG/ML IJ SOLN
INTRAMUSCULAR | Status: AC
  Administered 2011-11-29: 50 ug via INTRAVENOUS
  Filled 2011-11-29: qty 2

## 2011-11-29 MED ORDER — LIDOCAINE HCL (CARDIAC) 20 MG/ML IV SOLN
INTRAVENOUS | Status: DC | PRN
  Administered 2011-11-29: 60 mg via INTRAVENOUS

## 2011-11-29 MED ORDER — ONDANSETRON HCL 4 MG/2ML IJ SOLN
INTRAMUSCULAR | Status: AC
  Filled 2011-11-29: qty 2

## 2011-11-29 SURGICAL SUPPLY — 13 items
CATH ROBINSON RED A/P 16FR (CATHETERS) ×2 IMPLANT
CLOTH BEACON ORANGE TIMEOUT ST (SAFETY) ×2 IMPLANT
DERMABOND ADVANCED (GAUZE/BANDAGES/DRESSINGS) ×1
DERMABOND ADVANCED .7 DNX12 (GAUZE/BANDAGES/DRESSINGS) ×1 IMPLANT
GLOVE BIO SURGEON STRL SZ8.5 (GLOVE) ×6 IMPLANT
GOWN PREVENTION PLUS LG XLONG (DISPOSABLE) ×2 IMPLANT
GOWN PREVENTION PLUS XXLARGE (GOWN DISPOSABLE) ×2 IMPLANT
PACK LAPAROSCOPY BASIN (CUSTOM PROCEDURE TRAY) ×2 IMPLANT
SUT MON AB 4-0 PS1 27 (SUTURE) ×2 IMPLANT
SUT VIC AB 0 CT1 27 (SUTURE) ×1
SUT VIC AB 0 CT1 27XBRD ANBCTR (SUTURE) ×1 IMPLANT
TOWEL OR 17X24 6PK STRL BLUE (TOWEL DISPOSABLE) ×4 IMPLANT
WATER STERILE IRR 1000ML POUR (IV SOLUTION) ×2 IMPLANT

## 2011-11-29 NOTE — Transfer of Care (Signed)
Immediate Anesthesia Transfer of Care Note  Patient: Cynthia Hawkins  Procedure(s) Performed: Procedure(s) (LRB): LAPAROSCOPIC TUBAL LIGATION (Bilateral)  Patient Location: PACU  Anesthesia Type: General  Level of Consciousness: awake, alert  and oriented  Airway & Oxygen Therapy: Patient Spontanous Breathing and Patient connected to nasal cannula oxygen  Post-op Assessment: Report given to PACU RN  Post vital signs: Reviewed and stable  Complications: No apparent anesthesia complications

## 2011-11-29 NOTE — H&P (Signed)
  Has been no change in the history and physical since initial evaluation

## 2011-11-29 NOTE — Anesthesia Procedure Notes (Signed)
Procedure Name: Intubation Date/Time: 11/29/2011 8:14 AM Performed by: Michiah Mudry, Jannet Askew Pre-anesthesia Checklist: Patient identified, Emergency Drugs available, Suction available, Patient being monitored and Timeout performed Patient Re-evaluated:Patient Re-evaluated prior to inductionOxygen Delivery Method: Circle system utilized Preoxygenation: Pre-oxygenation with 100% oxygen Intubation Type: IV induction Ventilation: Mask ventilation without difficulty Laryngoscope Size: Mac and 3 Grade View: Grade I Tube type: Oral Tube size: 7.0 mm Number of attempts: 1 Airway Equipment and Method: Stylet Placement Confirmation: ETT inserted through vocal cords under direct vision,  positive ETCO2 and breath sounds checked- equal and bilateral Secured at: 22 cm Tube secured with: Tape Dental Injury: Teeth and Oropharynx as per pre-operative assessment

## 2011-11-29 NOTE — Anesthesia Postprocedure Evaluation (Signed)
Anesthesia Post Note  Patient: Cynthia Hawkins  Procedure(s) Performed: Procedure(s) (LRB): LAPAROSCOPIC TUBAL LIGATION (Bilateral)  Anesthesia type: General  Patient location: PACU  Post pain: Pain level controlled  Post assessment: Post-op Vital signs reviewed  Last Vitals:  Filed Vitals:   11/29/11 0945  BP:   Pulse: 52  Temp:   Resp: 16    Post vital signs: Reviewed  Level of consciousness: sedated  Complications: No apparent anesthesia complicationsfj

## 2011-11-29 NOTE — H&P (Unsigned)
NAME:  LINSEY, ARTEAGA                ACCOUNT NO.:  [id number]  MEDICAL RECORD NO.:  [id number]  LOCATION:  [facility]                           FACILITY:  [facility]  PHYSICIAN:  Kathreen Cosier, M.D.DATE OF BIRTH:  1985-07-30  DATE OF ADMISSION:  11/22/2011 DATE OF DISCHARGE:  11/22/2011                             HISTORY & PHYSICAL   HISTORY OF PRESENT ILLNESS:  The patient is a 26 year old, gravida 2, para 2-0-0-2 who desires sterilization.  The patient understands procedure can fail resulting in pregnancy on the tube or uterus.  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  PAST SURGICAL HISTORY:  Negative.  SOCIAL HISTORY:  Negative.  FAMILY HISTORY:  Negative.  SYSTEM REVIEW:  Negative.  PHYSICAL EXAMINATION:  GENERAL:  Revealed a well-developed female in no distress. HEENT:  Negative. LUNGS:  Clear to P and A. HEART:  Regular rhythm.  No murmurs or gallops. BREASTS:  No masses. ABDOMEN:  Negative. GU:  Pelvic, uterus normal size.  Negative adnexa.  The cervix and vagina and external genitalia normal. EXTREMITIES:  Negative.          ______________________________ Kathreen Cosier, M.D.     BAM/MEDQ  D:  11/28/2011  T:  11/29/2011  Job:  [id number]

## 2011-11-29 NOTE — Op Note (Signed)
preop diagnosis multiparity Postop diagnosis the same [facility] open laparoscopic tubal sterilization Anesthesia general Surgeon Dr. Francoise Ceo procedure on the general anesthesia patient in the lithotomy position abdomen perineum and vagina prepped and draped data entered with a straight a straight catheter speculum placed in the vagina cervix grasped with tenaculum in the umbilicus incision was made carried down to the fascia fascia cleaned grasped  With kocher clamps and the fascia and the peritoneum opened the Mayo scissors the sleeve of the trocar was inserted and 3 L carbon dioxide   infused intraperitoneally  Visualizing  scope inserted uterus was retroverted tubes and ovaries normal cautery probe inserted through the sleeve the scope the right tube GRAS 1 inch from the  Cornu    cauterized a total of 4 places  l  procedure done a similar fashion on  The other  side the  lap and sponge counts correct CO2 allowed to escape from the peritoneal cavity fascia closed in one stitch of 0 Vicryl the skin  closed a subcuticular stitch of 4-0 Monocryl patient tolerated procedure well in the recovery in good condition end of dictation dictated by Dr. Gaynell Face 11/29/2011

## 2011-11-29 NOTE — Anesthesia Preprocedure Evaluation (Signed)
Anesthesia Evaluation  Patient identified by MRN, date of birth, ID band Patient awake    Reviewed: Allergy & Precautions, H&P , NPO status , Patient's Chart, lab work & pertinent test results, reviewed documented beta blocker date and time   History of Anesthesia Complications Negative for: history of anesthetic complications  Airway Mallampati: I TM Distance: >3 FB Neck ROM: full    Dental  (+) Teeth Intact   Pulmonary neg pulmonary ROS,  breath sounds clear to auscultation  Pulmonary exam normal       Cardiovascular Exercise Tolerance: Good negative cardio ROS  Rhythm:regular Rate:Normal     Neuro/Psych PSYCHIATRIC DISORDERS (depression) negative neurological ROS     GI/Hepatic negative GI ROS, Neg liver ROS,   Endo/Other  negative endocrine ROS  Renal/GU negative Renal ROS  negative genitourinary   Musculoskeletal   Abdominal   Peds  Hematology negative hematology ROS (+)   Anesthesia Other Findings   Reproductive/Obstetrics (+) Breast feeding  (has a 25.44 month old)                           Anesthesia Physical Anesthesia Plan  ASA: II  Anesthesia Plan: General ETT   Post-op Pain Management:    Induction:   Airway Management Planned:   Additional Equipment:   Intra-op Plan:   Post-operative Plan:   Informed Consent: I have reviewed the patients History and Physical, chart, labs and discussed the procedure including the risks, benefits and alternatives for the proposed anesthesia with the patient or authorized representative who has indicated his/her understanding and acceptance.   Dental Advisory Given  Plan Discussed with: CRNA and Surgeon  Anesthesia Plan Comments:         Anesthesia Quick Evaluation

## 2013-05-06 ENCOUNTER — Encounter (HOSPITAL_COMMUNITY): Payer: Self-pay | Admitting: Emergency Medicine

## 2013-05-06 ENCOUNTER — Emergency Department (INDEPENDENT_AMBULATORY_CARE_PROVIDER_SITE_OTHER)
Admission: EM | Admit: 2013-05-06 | Discharge: 2013-05-06 | Disposition: A | Discharge status: Home / Self Care | Payer: Medicaid Other | Source: Home / Self Care

## 2013-05-06 DIAGNOSIS — J020 Streptococcal pharyngitis: Secondary | ICD-10-CM

## 2013-05-06 LAB — POCT RAPID STREP A: STREPTOCOCCUS, GROUP A SCREEN (DIRECT): POSITIVE — AB

## 2013-05-06 MED ORDER — AMOXICILLIN 500 MG PO CAPS: 1000.0000 mg | ORAL_CAPSULE | Freq: Two times a day (BID) | ORAL | Status: AC

## 2013-05-06 NOTE — ED Provider Notes (Signed)
Medical screening examination/treatment/procedure(s) were performed by non-physician practitioner and as supervising physician I was immediately available for consultation/collaboration.  Jayquan Bradsher, M.D.  Anmol Fleck C Zohair Epp, MD 05/06/13 2101 

## 2013-05-06 NOTE — ED Notes (Signed)
C/o cold sx States she has a sore throat, headache, fever, and swollen lymph nodes No medications taking

## 2013-05-06 NOTE — ED Provider Notes (Signed)
CSN: [id number]     Arrival date & time 05/06/13  0935 History   First MD Initiated Contact with Patient 05/06/13 [id number]     Chief Complaint  Patient presents with  . URI   (Consider location/radiation/quality/duration/timing/severity/associated sxs/prior Treatment) HPI Comments: 28 year old obese female presents with a sore throat for 2 days   Past Medical History  Diagnosis Date  . No pertinent past medical history   . Depression     post partum depression   Past Surgical History  Procedure Laterality Date  . No past surgeries    . Laparoscopic tubal ligation  11/29/2011    Procedure: LAPAROSCOPIC TUBAL LIGATION;  Surgeon: Kathreen CosierBernard A Marshall, MD;  Location: [facility];  Service: Gynecology;  Laterality: Bilateral;   Family History  Problem Relation Age of Onset  . Anesthesia problems Neg Hx    History  Substance Use Topics  . Smoking status: Never Smoker   . Smokeless tobacco: Not on file  . Alcohol Use: No   OB History   Grav Para Term Preterm Abortions TAB SAB Ect Mult Living   2 2 2  0 0 0 0 0 0 2     Review of Systems  Constitutional: Positive for fever, activity change, appetite change and fatigue. Negative for chills.  HENT: Positive for congestion, postnasal drip and sore throat. Negative for facial swelling and rhinorrhea.   Eyes: Negative.   Respiratory: Negative.  Negative for shortness of breath and wheezing.   Cardiovascular: Negative.   Gastrointestinal: Negative.   Musculoskeletal: Negative for neck pain and neck stiffness.  Skin: Negative for pallor and rash.  Neurological: Negative.     Allergies  Review of patient's allergies indicates no known allergies.  Home Medications   Current Outpatient Rx  Name  Route  Sig  Dispense  Refill  . acetaminophen-codeine (TYLENOL #3) 300-30 MG per tablet   Oral   Take 1 tablet by mouth every 4 (four) hours as needed. For back pain         . amoxicillin (AMOXIL) 500 MG capsule   Oral   Take 2 capsules (1,000  mg total) by mouth 2 (two) times daily.   28 capsule   0   . Prenatal Vit-Fe Fumarate-FA (MULTIVITAMIN-PRENATAL) 27-0.8 MG TABS   Oral   Take 1 tablet by mouth daily.         . sertraline (ZOLOFT) 100 MG tablet   Oral   Take 100 mg by mouth daily.          BP 110/81  Pulse 120  Temp(Src) 100 F (37.8 C) (Oral)  Resp 18  SpO2 98%  LMP 04/26/2013 Physical Exam  Nursing note and vitals reviewed. Constitutional: She is oriented to person, place, and time. She appears well-developed and well-nourished. No distress.  HENT:  Mouth/Throat: No oropharyngeal exudate.  Oropharynx with diffuse smooth deep erythema with mild swelling. Uvula is midline. No exudates; airway widely patent.  Eyes: Conjunctivae and EOM are normal.  Neck: Normal range of motion. Neck supple.  Solitary right posterior cervical lymph node enlarged and tender.  Cardiovascular: Normal rate, regular rhythm and normal heart sounds.   Pulmonary/Chest: Effort normal and breath sounds normal. No respiratory distress. She has no wheezes. She has no rales.  Musculoskeletal: Normal range of motion. She exhibits no edema.  Lymphadenopathy:    She has cervical adenopathy.  Neurological: She is alert and oriented to person, place, and time.  Skin: Skin is warm and dry. No rash noted.  Psychiatric: She has a normal mood and affect.    ED Course  Procedures (including critical care time) Labs Review Labs Reviewed  POCT RAPID STREP A ([facility] CARE ONLY) - Abnormal; Notable for the following:    Streptococcus, Group A Screen (Direct) POSITIVE (*)    All other components within normal limits   Imaging Review No results found.    MDM   1. Streptococcal pharyngitis      Amoxicillin 1 g twice a day for 7 days Drink plenty of fluids stay well hydrated Tylenol every 4 hours when necessary. May also take ibuprofen 600 mg every 6-8 hours when necessary pain. Cepacol lozenges and Chloraseptic throat spray.  Hayden Rasmussen, NP 05/06/13 1011

## 2013-05-06 NOTE — Discharge Instructions (Signed)
Strep Throat Strep throat is an infection of the throat caused by a bacteria named Streptococcus pyogenes. Your caregiver may call the infection streptococcal "tonsillitis" or "pharyngitis" depending on whether there are signs of inflammation in the tonsils or back of the throat. Strep throat is most common in children aged 28 15 years during the cold months of the year, but it can occur in people of any age during any season. This infection is spread from person to person (contagious) through coughing, sneezing, or other close contact. SYMPTOMS   Fever or chills.  Painful, swollen, red tonsils or throat.  Pain or difficulty when swallowing.  White or yellow spots on the tonsils or throat.  Swollen, tender lymph nodes or "glands" of the neck or under the jaw.  Red rash all over the body (rare). DIAGNOSIS  Many different infections can cause the same symptoms. A test must be done to confirm the diagnosis so the right treatment can be given. A "rapid strep test" can help your caregiver make the diagnosis in a few minutes. If this test is not available, a light swab of the infected area can be used for a throat culture test. If a throat culture test is done, results are usually available in a day or two. TREATMENT  Strep throat is treated with antibiotic medicine. HOME CARE INSTRUCTIONS   Gargle with 1 tsp of salt in 1 cup of warm water, 3 4 times per day or as needed for comfort.  Family members who also have a sore throat or fever should be tested for strep throat and treated with antibiotics if they have the strep infection.  Make sure everyone in your household washes their hands well.  Do not share food, drinking cups, or personal items that could cause the infection to spread to others.  You may need to eat a soft food diet until your sore throat gets better.  Drink enough water and fluids to keep your urine clear or pale yellow. This will help prevent dehydration.  Get plenty of  rest.  Stay home from school, daycare, or work until you have been on antibiotics for 24 hours.  Only take over-the-counter or prescription medicines for pain, discomfort, or fever as directed by your caregiver.  If antibiotics are prescribed, take them as directed. Finish them even if you start to feel better. SEEK MEDICAL CARE IF:   The glands in your neck continue to enlarge.  You develop a rash, cough, or earache.  You cough up green, yellow-brown, or bloody sputum.  You have pain or discomfort not controlled by medicines.  Your problems seem to be getting worse rather than better. SEEK IMMEDIATE MEDICAL CARE IF:   You develop any new symptoms such as vomiting, severe headache, stiff or painful neck, chest pain, shortness of breath, or trouble swallowing.  You develop severe throat pain, drooling, or changes in your voice.  You develop swelling of the neck, or the skin on the neck becomes red and tender.  You have a fever.  You develop signs of dehydration, such as fatigue, dry mouth, and decreased urination.  You become increasingly sleepy, or you cannot wake up completely. Document Released: 04/06/2000 Document Revised: 03/26/2012 Document Reviewed: 06/08/2010 [facility] Patient Information 2014 [place], [place].  Sore Throat A sore throat is pain, burning, irritation, or scratchiness of the throat. There is often pain or tenderness when swallowing or talking. A sore throat may be accompanied by other symptoms, such as coughing, sneezing, fever, and swollen  neck glands. A sore throat is often the first sign of another sickness, such as a cold, flu, strep throat, or mononucleosis (commonly known as mono). Most sore throats go away without medical treatment. CAUSES  The most common causes of a sore throat include:  A viral infection, such as a cold, flu, or mono.  A bacterial infection, such as strep throat, tonsillitis, or whooping cough.  Seasonal allergies.  Dryness  in the air.  Irritants, such as smoke or pollution.  Gastroesophageal reflux disease (GERD). HOME CARE INSTRUCTIONS   Only take over-the-counter medicines as directed by your caregiver.  Drink enough fluids to keep your urine clear or pale yellow.  Rest as needed.  Try using throat sprays, lozenges, or sucking on hard candy to ease any pain (if older than 4 years or as directed).  Sip warm liquids, such as broth, herbal tea, or warm water with honey to relieve pain temporarily. You may also eat or drink cold or frozen liquids such as frozen ice pops.  Gargle with salt water (mix 1 tsp salt with 8 oz of water).  Do not smoke and avoid secondhand smoke.  Put a cool-mist humidifier in your bedroom at night to moisten the air. You can also turn on a hot shower and sit in the bathroom with the door closed for 5 10 minutes. SEEK IMMEDIATE MEDICAL CARE IF:  You have difficulty breathing.  You are unable to swallow fluids, soft foods, or your saliva.  You have increased swelling in the throat.  Your sore throat does not get better in 7 days.  You have nausea and vomiting.  You have a fever or persistent symptoms for more than 2 3 days.  You have a fever and your symptoms suddenly get worse. MAKE SURE YOU:   Understand these instructions.  Will watch your condition.  Will get help right away if you are not doing well or get worse. Document Released: 05/17/2004 Document Revised: 03/26/2012 Document Reviewed: 12/16/2011 [facility] Patient Information 2014 [place]

## 2013-07-11 ENCOUNTER — Encounter (HOSPITAL_COMMUNITY): Payer: Self-pay | Admitting: Emergency Medicine

## 2013-07-11 ENCOUNTER — Emergency Department (HOSPITAL_COMMUNITY)
Admission: EM | Admit: 2013-07-11 | Discharge: 2013-07-11 | Disposition: A | Discharge status: Home / Self Care | Payer: Medicaid Other | Source: Home / Self Care | Attending: Family Medicine | Admitting: Family Medicine

## 2013-07-11 DIAGNOSIS — K047 Periapical abscess without sinus: Secondary | ICD-10-CM

## 2013-07-11 MED ORDER — CLINDAMYCIN HCL 300 MG PO CAPS: 300.0000 mg | ORAL_CAPSULE | Freq: Four times a day (QID) | ORAL | Status: AC

## 2013-07-11 MED ORDER — HYDROCODONE-ACETAMINOPHEN 5-325 MG PO TABS: 1.0000 | ORAL_TABLET | Freq: Four times a day (QID) | ORAL | Status: AC | PRN

## 2013-07-11 NOTE — ED Provider Notes (Signed)
CSN: [id number]     Arrival date & time 07/11/13  1228 History   First MD Initiated Contact with Patient 07/11/13 1354     Chief Complaint  Patient presents with  . Dental Pain   (Consider location/radiation/quality/duration/timing/severity/associated sxs/prior Treatment) HPI Comments: Patient presents with right upper dental pain with associated facial pain and swelling that began 24-36 hours PTA. No fever.  The history is provided by the patient.    Past Medical History  Diagnosis Date  . No pertinent past medical history   . Depression     post partum depression   Past Surgical History  Procedure Laterality Date  . No past surgeries    . Laparoscopic tubal ligation  11/29/2011    Procedure: LAPAROSCOPIC TUBAL LIGATION;  Surgeon: Kathreen Cosier, MD;  Location: [facility];  Service: Gynecology;  Laterality: Bilateral;   Family History  Problem Relation Age of Onset  . Anesthesia problems Neg Hx    History  Substance Use Topics  . Smoking status: Never Smoker   . Smokeless tobacco: Not on file  . Alcohol Use: No   OB History   Grav Para Term Preterm Abortions TAB SAB Ect Mult Living   2 2 2  0 0 0 0 0 0 2     Review of Systems  All other systems reviewed and are negative.    Allergies  Review of patient's allergies indicates no known allergies.  Home Medications   Current Outpatient Rx  Name  Route  Sig  Dispense  Refill  . Naproxen Sodium (ALEVE PO)   Oral   Take by mouth.         Marland Kitchen acetaminophen-codeine (TYLENOL #3) 300-30 MG per tablet   Oral   Take 1 tablet by mouth every 4 (four) hours as needed. For back pain         . amoxicillin (AMOXIL) 500 MG capsule   Oral   Take 2 capsules (1,000 mg total) by mouth 2 (two) times daily.   28 capsule   0   . clindamycin (CLEOCIN) 300 MG capsule   Oral   Take 1 capsule (300 mg total) by mouth 4 (four) times daily. X 7 days   28 capsule   0   . HYDROcodone-acetaminophen (NORCO/VICODIN) 5-325 MG per  tablet   Oral   Take 1 tablet by mouth every 6 (six) hours as needed for moderate pain or severe pain.   10 tablet   0   . Prenatal Vit-Fe Fumarate-FA (MULTIVITAMIN-PRENATAL) 27-0.8 MG TABS   Oral   Take 1 tablet by mouth daily.         . sertraline (ZOLOFT) 100 MG tablet   Oral   Take 100 mg by mouth daily.          BP 113/75  Pulse 99  Temp(Src) 98.2 F (36.8 C) (Oral)  Resp 16  SpO2 99%  LMP 06/21/2013 Physical Exam  Nursing note and vitals reviewed. Constitutional: She is oriented to person, place, and time. She appears well-developed and well-nourished. No distress.  HENT:  Head: Normocephalic and atraumatic.  Right Ear: Hearing and external ear normal.  Left Ear: Hearing and external ear normal.  Nose: Nose normal.  Mouth/Throat: Uvula is midline, oropharynx is clear and moist and mucous membranes are normal. She does not have dentures. No oral lesions. There is trismus in the jaw. Normal dentition. No uvula swelling, lacerations or dental caries.    Moderate tender right facial STS at right  upper cheek  Eyes: Conjunctivae are normal. No scleral icterus.  Neck: Normal range of motion. Neck supple.  +shotty tender right anterior cervical lymphadenopathy  Cardiovascular: Normal rate.   Pulmonary/Chest: Effort normal and breath sounds normal.  Neurological: She is alert and oriented to person, place, and time.  Skin: Skin is warm and dry.    ED Course  Procedures (including critical care time) Labs Review Labs Reviewed - No data to display Imaging Review No results found.   MDM   1. Dental abscess    Will provide empiric coverage for probable right upper periapical abscess and provide Rx for oral clindamycin and information regarding dental follow up.     Jess BartersJennifer Lee South Fry[place]kPress[place], [place] 07/11/13 [phone]

## 2013-07-11 NOTE — ED Notes (Signed)
Top, right tooth pain, face is swollen on right side.  Patient does not have a dentist

## 2013-07-11 NOTE — Discharge Instructions (Signed)
°  Dental Abscess °A dental abscess is a collection of infected fluid (pus) from a bacterial infection in the inner part of the tooth (pulp). It usually occurs at the end of the tooth's root.  °CAUSES  °· Severe tooth decay. °· Trauma to the tooth that allows bacteria to enter into the pulp, such as a broken or chipped tooth. °SYMPTOMS  °· Severe pain in and around the infected tooth. °· Swelling and redness around the abscessed tooth or in the mouth or face. °· Tenderness. °· Pus drainage. °· Bad breath. °· Bitter taste in the mouth. °· Difficulty swallowing. °· Difficulty opening the mouth. °· Nausea. °· Vomiting. °· Chills. °· Swollen neck glands. °DIAGNOSIS  °· A medical and dental history will be taken. °· An examination will be performed by tapping on the abscessed tooth. °· X-rays may be taken of the tooth to identify the abscess. °TREATMENT °The goal of treatment is to eliminate the infection. You may be prescribed antibiotic medicine to stop the infection from spreading. A root canal may be performed to save the tooth. If the tooth cannot be saved, it may be pulled (extracted) and the abscess may be drained.  °HOME CARE INSTRUCTIONS °· Only take over-the-counter or prescription medicines for pain, fever, or discomfort as directed by your caregiver. °· Rinse your mouth (gargle) often with salt water (¼ tsp salt in 8 oz [250 ml] of warm water) to relieve pain or swelling. °· Do not drive after taking pain medicine (narcotics). °· Do not apply heat to the outside of your face. °· Return to your dentist for further treatment as directed. °SEEK MEDICAL CARE IF: °· Your pain is not helped by medicine. °· Your pain is getting worse instead of better. °SEEK IMMEDIATE MEDICAL CARE IF: °· You have a fever or persistent symptoms for more than 2 3 days. °· You have a fever and your symptoms suddenly get worse. °· You have chills or a very bad headache. °· You have problems breathing or swallowing. °· You have trouble  opening your mouth. °· You have swelling in the neck or around the eye. °Document Released: 04/09/2005 Document Revised: 01/02/2012 Document Reviewed: 07/18/2010 °[employer]® Patient Information ©2014 [employer]. ° ° °

## 2013-07-13 NOTE — ED Provider Notes (Signed)
Medical screening examination/treatment/procedure(s) were performed by a resident physician or non-physician practitioner and as the supervising physician I was immediately available for consultation/collaboration.  Tamanika Heiney, MD    Aarin Sparkman S Jejuan Scala, MD 07/13/13 0805 

## 2014-02-22 ENCOUNTER — Encounter (HOSPITAL_COMMUNITY): Payer: Self-pay | Admitting: Emergency Medicine
